# Patient Record
Sex: Male | Born: 1960 | Race: White | Hispanic: No | Marital: Married | Smoking: Current every day smoker
Health system: Southern US, Community
[De-identification: ages and names within clinical notes are randomized; demographics above are authoritative.]

## PROBLEM LIST (undated history)

## (undated) DIAGNOSIS — R112 Nausea with vomiting, unspecified: Secondary | ICD-10-CM

## (undated) DIAGNOSIS — R634 Abnormal weight loss: Secondary | ICD-10-CM

## (undated) DIAGNOSIS — R197 Diarrhea, unspecified: Secondary | ICD-10-CM

## (undated) DIAGNOSIS — I10 Essential (primary) hypertension: Secondary | ICD-10-CM

## (undated) DIAGNOSIS — J3489 Other specified disorders of nose and nasal sinuses: Secondary | ICD-10-CM

## (undated) DIAGNOSIS — Z87440 Personal history of urinary (tract) infections: Secondary | ICD-10-CM

## (undated) DIAGNOSIS — E785 Hyperlipidemia, unspecified: Secondary | ICD-10-CM

## (undated) DIAGNOSIS — N189 Chronic kidney disease, unspecified: Secondary | ICD-10-CM

## (undated) DIAGNOSIS — Z973 Presence of spectacles and contact lenses: Secondary | ICD-10-CM

## (undated) HISTORY — DX: Nausea with vomiting, unspecified: R11.2

## (undated) HISTORY — PX: INNER EAR SURGERY: SHX679

## (undated) HISTORY — DX: Chronic kidney disease, unspecified: N18.9

## (undated) HISTORY — DX: Presence of spectacles and contact lenses: Z97.3

## (undated) HISTORY — DX: Other specified disorders of nose and nasal sinuses: J34.89

## (undated) HISTORY — PX: HERNIA REPAIR: SHX51

## (undated) HISTORY — DX: Essential (primary) hypertension: I10

## (undated) HISTORY — PX: URETHRAL DILATION: SUR417

## (undated) HISTORY — PX: OTHER SURGICAL HISTORY: SHX169

## (undated) HISTORY — DX: Abnormal weight loss: R63.4

## (undated) HISTORY — DX: Personal history of urinary (tract) infections: Z87.440

## (undated) HISTORY — DX: Hyperlipidemia, unspecified: E78.5

## (undated) HISTORY — DX: Diarrhea, unspecified: R19.7

---

## 2011-03-08 ENCOUNTER — Inpatient Hospital Stay (INDEPENDENT_AMBULATORY_CARE_PROVIDER_SITE_OTHER)
Admission: RE | Admit: 2011-03-08 | Discharge: 2011-03-08 | Disposition: A | Discharge status: Home / Self Care | Payer: 59 | Source: Ambulatory Visit | Attending: Family Medicine | Admitting: Family Medicine

## 2011-03-08 ENCOUNTER — Other Ambulatory Visit: Payer: Self-pay | Admitting: Family Medicine

## 2011-03-08 ENCOUNTER — Ambulatory Visit
Admission: RE | Admit: 2011-03-08 | Discharge: 2011-03-08 | Disposition: A | Discharge status: Home / Self Care | Payer: 59 | Source: Ambulatory Visit | Attending: Family Medicine | Admitting: Family Medicine

## 2011-03-08 ENCOUNTER — Encounter: Payer: Self-pay | Admitting: Family Medicine

## 2011-03-08 DIAGNOSIS — J449 Chronic obstructive pulmonary disease, unspecified: Secondary | ICD-10-CM | POA: Insufficient documentation

## 2011-03-08 DIAGNOSIS — E785 Hyperlipidemia, unspecified: Secondary | ICD-10-CM | POA: Insufficient documentation

## 2011-03-08 DIAGNOSIS — J4489 Other specified chronic obstructive pulmonary disease: Secondary | ICD-10-CM | POA: Insufficient documentation

## 2011-03-08 DIAGNOSIS — I10 Essential (primary) hypertension: Secondary | ICD-10-CM | POA: Insufficient documentation

## 2011-03-08 DIAGNOSIS — M109 Gout, unspecified: Secondary | ICD-10-CM | POA: Insufficient documentation

## 2011-03-08 DIAGNOSIS — R079 Chest pain, unspecified: Secondary | ICD-10-CM

## 2011-03-08 DIAGNOSIS — R1011 Right upper quadrant pain: Secondary | ICD-10-CM

## 2011-03-08 DIAGNOSIS — E119 Type 2 diabetes mellitus without complications: Secondary | ICD-10-CM | POA: Insufficient documentation

## 2011-03-08 LAB — CONVERTED CEMR LAB
Bilirubin Urine: NEGATIVE
Protein, U semiquant: NEGATIVE
Urobilinogen, UA: 0.2
WBC Urine, dipstick: NEGATIVE

## 2011-03-09 ENCOUNTER — Encounter: Payer: Self-pay | Admitting: Family Medicine

## 2011-03-12 ENCOUNTER — Ambulatory Visit
Admission: RE | Admit: 2011-03-12 | Discharge: 2011-03-12 | Disposition: A | Discharge status: Home / Self Care | Payer: 59 | Source: Ambulatory Visit | Attending: Family Medicine | Admitting: Family Medicine

## 2011-03-12 ENCOUNTER — Encounter (INDEPENDENT_AMBULATORY_CARE_PROVIDER_SITE_OTHER): Payer: Self-pay | Admitting: General Surgery

## 2011-03-12 DIAGNOSIS — R1011 Right upper quadrant pain: Secondary | ICD-10-CM

## 2011-03-13 ENCOUNTER — Ambulatory Visit (INDEPENDENT_AMBULATORY_CARE_PROVIDER_SITE_OTHER): Payer: 59 | Admitting: General Surgery

## 2011-03-13 ENCOUNTER — Encounter (INDEPENDENT_AMBULATORY_CARE_PROVIDER_SITE_OTHER): Payer: Self-pay | Admitting: General Surgery

## 2011-03-13 VITALS — BP 134/74 | HR 93 | Ht 70.5 in | Wt 245.2 lb

## 2011-03-13 DIAGNOSIS — K811 Chronic cholecystitis: Secondary | ICD-10-CM

## 2011-03-13 MED ORDER — HYDROCODONE-ACETAMINOPHEN 5-500 MG PO TABS: 2.0000 | ORAL_TABLET | Freq: Four times a day (QID) | ORAL | Status: AC | PRN

## 2011-03-13 MED ORDER — CIPROFLOXACIN HCL 500 MG PO TABS: 500.0000 mg | ORAL_TABLET | Freq: Two times a day (BID) | ORAL | Status: AC

## 2011-03-13 NOTE — Progress Notes (Signed)
Subjective:     Patient ID: Jason Booker, male   DOB: 04-May-1961, 50 y.o.   MRN: [id number]  HPI This is a 50 year old Caucasian male with non-insulin-dependent diabetes, hyperlipidemia, and hypertension and obesity. He is referred to me by Dr. Elsie Saas at the [facility] med center urgent care [facility] for evaluation of gallbladder disease  The patient gives a three-week history of intermittent episodes of right upper quadrant pain right flank pain and back pain. This has been intermittent but has been getting worse. He's had some nausea but no vomiting or fever. He had a couple episodes of diarrhea which is now resolved. He denies any prior similar problems. The pain is worse after meals and he is restricting his diet for the past few days and he feels much better today.  He was evaluated at med center urgent care [facility] on March 08, 2011. Gallbladder ultrasound shows a distended gallbladder with gallbladder polyps, minimal pericholecystic fluid, borderline dilated common bile duct and equivocal Murphy sign. Dr. Park Liter felt that this was consistent with acalculous cholecystitis. Diffuse fatty infiltration of the liver was also noted.  Liver function tests and renal function were normal. Amylase and lipase were normal. Urinalysis was clear.  He is feeling better today.  Past Medical History  Diagnosis Date  . Diabetes mellitus   . Gout   . Hyperlipidemia   . Hypertension   . Weight loss   . Nausea vomiting and diarrhea   . Hearing loss   . Sinus pain   . Wears glasses   . Chronic kidney disease   . History of recurrent UTIs     has a current medication list which includes the following prescription(s): aspirin, bupropion, cinnamon, fenofibrate, lisinopril, metformin, promethazine, simvastatin, vitamin b-12, ciprofloxacin, and hydrocodone-acetaminophen.  Allergies  Allergen Reactions  . Amoxicillin   . Codeine Hives  . Demerol     Family History  Problem Relation  Age of Onset  . Cancer Mother     lung   . Heart disease Father   . Diabetes Father   . Dementia Father     History  Substance Use Topics  . Smoking status: Current Everyday Smoker -- 1.0 packs/day  . Smokeless tobacco: Not on file  . Alcohol Use: No   Review of Systems  Constitutional: Positive for appetite change. Negative for fever, chills, diaphoresis, fatigue and unexpected weight change.  HENT: Negative.   Eyes: Negative.   Respiratory: Negative.   Cardiovascular: Negative.   Gastrointestinal: Positive for nausea, abdominal pain and diarrhea. Negative for vomiting, constipation, blood in stool, abdominal distention, anal bleeding and rectal pain.  Genitourinary: Negative.   Musculoskeletal: Negative.   Neurological: Negative.   Hematological: Negative.   Psychiatric/Behavioral: Negative.        Objective:   Physical Exam  Constitutional: He is oriented to person, place, and time. He appears well-developed and well-nourished. No distress.  HENT:  Head: Normocephalic and atraumatic.  Mouth/Throat: No oropharyngeal exudate.  Eyes: Conjunctivae and EOM are normal. Pupils are equal, round, and reactive to light. Left eye exhibits discharge. No scleral icterus.  Neck: Normal range of motion. Neck supple. No tracheal deviation present. No thyromegaly present.  Cardiovascular: Normal rate, regular rhythm and normal heart sounds.   No murmur heard. Pulmonary/Chest: Effort normal and breath sounds normal. No respiratory distress. He has no wheezes. He has no rales. He exhibits no tenderness.  Abdominal: Soft. Bowel sounds are normal. He exhibits no distension and no mass. There is tenderness.  There is no rebound and no guarding.    Genitourinary: No penile tenderness.  Musculoskeletal: Normal range of motion. He exhibits no edema and no tenderness.  Lymphadenopathy:    He has no cervical adenopathy.  Neurological: He is alert and oriented to person, place, and time. No  cranial nerve deficit. He exhibits normal muscle tone.  Skin: Skin is warm and dry. No rash noted. He is not diaphoretic. No erythema. No pallor.  Psychiatric: He has a normal mood and affect. His behavior is normal. Judgment and thought content normal.       Assessment:     Chronic cholecystitis, this may be acalculus or calculus.  Obesity.  Non-insulin-dependent diabetes mellitus.  Hyperlipidemia.  Hypertension.  Status post bilateral inguinal hernia repair.  Status post diagnostic laparoscopy and urethral dilatation for urethral stricture. Details unknown.    Plan:       Considering his diabetes, history, physical, and ultrasound findings, I think that we should proceed with cholecystectomy in the near future. He is at risk for complications from his biliary tract disease.  We will schedule for laparoscopic cholecystectomy with cholangiogram.  I discussed the indications and details of the surgery with him. Risks and complications have been outlined, including but limited to bleeding, infection, conversion to open laparotomy, injury to adjacent organs such as the intestine or major bile duct with major reconstructive surgery, bile leak, wound healing problems, cardiac pulmonary and thromboembolic problems. He seems to understand these issues well. At this time all of his questions are answered. He is in full agreement as planned. We'll schedule in the very near future.

## 2011-03-13 NOTE — Patient Instructions (Signed)
We have called in a prescription for Cipro to your pharmacy in [place]. We have written a prescription for Vicodin tablets for your pain. You are to adhere to a very low fat diet and to stay hydrated. Keep a close monitoring on your diabetes and blood sugars. We will schedule you for a laparoscopic cholecystectomy with cholangiogram as soon as possible in the future.

## 2011-03-21 ENCOUNTER — Other Ambulatory Visit (INDEPENDENT_AMBULATORY_CARE_PROVIDER_SITE_OTHER): Payer: Self-pay | Admitting: General Surgery

## 2011-03-21 ENCOUNTER — Encounter (HOSPITAL_COMMUNITY): Payer: 59

## 2011-03-21 LAB — COMPREHENSIVE METABOLIC PANEL
CO2: 25 mEq/L (ref 19–32)
Chloride: 99 mEq/L (ref 96–112)
GFR calc Af Amer: >60 mL/min (ref >60)
Potassium: 3.5 mEq/L (ref 3.5–5.1)
Sodium: 136 mEq/L (ref 135–145)

## 2011-03-21 LAB — URINALYSIS, ROUTINE W REFLEX MICROSCOPIC
Glucose, UA: NEGATIVE mg/dL
Ketones, ur: NEGATIVE mg/dL
Leukocytes, UA: NEGATIVE
Specific Gravity, Urine: 1.017 (ref 1.005–1.030)
pH: 6.5 (ref 5.0–8.0)

## 2011-03-21 LAB — DIFFERENTIAL
Basophils Relative: 1 % (ref 0–1)
Lymphs Abs: 3.3 10*3/uL (ref 0.7–4.0)
Monocytes Relative: 8 % (ref 3–12)
Neutro Abs: 4.2 10*3/uL (ref 1.7–7.7)
Neutrophils Relative %: 50 % (ref 43–77)

## 2011-03-21 LAB — CBC
Hemoglobin: 15.5 g/dL (ref 13.0–17.0)
MCH: 30 pg (ref 26.0–34.0)
RBC: 5.17 MIL/uL (ref 4.22–5.81)

## 2011-03-21 LAB — SURGICAL PCR SCREEN: MRSA, PCR: NEGATIVE

## 2011-03-27 ENCOUNTER — Ambulatory Visit (HOSPITAL_COMMUNITY)
Admission: RE | Admit: 2011-03-27 | Discharge: 2011-03-28 | Disposition: A | Discharge status: Home / Self Care | Payer: 59 | Source: Ambulatory Visit | Attending: General Surgery | Admitting: General Surgery

## 2011-03-27 ENCOUNTER — Other Ambulatory Visit (INDEPENDENT_AMBULATORY_CARE_PROVIDER_SITE_OTHER): Payer: Self-pay | Admitting: General Surgery

## 2011-03-27 ENCOUNTER — Ambulatory Visit (HOSPITAL_COMMUNITY): Payer: 59

## 2011-03-27 DIAGNOSIS — Z01810 Encounter for preprocedural cardiovascular examination: Secondary | ICD-10-CM | POA: Insufficient documentation

## 2011-03-27 DIAGNOSIS — E119 Type 2 diabetes mellitus without complications: Secondary | ICD-10-CM | POA: Insufficient documentation

## 2011-03-27 DIAGNOSIS — K8010 Calculus of gallbladder with chronic cholecystitis without obstruction: Secondary | ICD-10-CM

## 2011-03-27 DIAGNOSIS — E785 Hyperlipidemia, unspecified: Secondary | ICD-10-CM | POA: Insufficient documentation

## 2011-03-27 DIAGNOSIS — K819 Cholecystitis, unspecified: Secondary | ICD-10-CM

## 2011-03-27 DIAGNOSIS — Z01812 Encounter for preprocedural laboratory examination: Secondary | ICD-10-CM | POA: Insufficient documentation

## 2011-03-27 DIAGNOSIS — F17200 Nicotine dependence, unspecified, uncomplicated: Secondary | ICD-10-CM | POA: Insufficient documentation

## 2011-03-27 DIAGNOSIS — I10 Essential (primary) hypertension: Secondary | ICD-10-CM | POA: Insufficient documentation

## 2011-03-27 DIAGNOSIS — R1011 Right upper quadrant pain: Secondary | ICD-10-CM | POA: Insufficient documentation

## 2011-03-27 DIAGNOSIS — G4733 Obstructive sleep apnea (adult) (pediatric): Secondary | ICD-10-CM | POA: Insufficient documentation

## 2011-03-27 LAB — GLUCOSE, CAPILLARY
Glucose-Capillary: 120 mg/dL — ABNORMAL HIGH (ref 70–99)
Glucose-Capillary: 136 mg/dL — ABNORMAL HIGH (ref 70–99)

## 2011-03-28 ENCOUNTER — Telehealth (INDEPENDENT_AMBULATORY_CARE_PROVIDER_SITE_OTHER): Payer: Self-pay

## 2011-03-28 LAB — GLUCOSE, CAPILLARY

## 2011-03-28 NOTE — Telephone Encounter (Signed)
NEED POST OP APPOINTMENT WITH DR. Zachery Dakins

## 2011-03-28 NOTE — Op Note (Signed)
Jason Booker, Jason Booker           ACCOUNT NO.:  [id number]  MEDICAL RECORD NO.:  [id number]  LOCATION:  1535                         FACILITY:  [facility]  PHYSICIAN:  Angelia Mould. Derrell Lolling, M.D.DATE OF BIRTH:  [birth year]  DATE OF PROCEDURE:  03/27/2011 DATE OF DISCHARGE:                              OPERATIVE REPORT   PREOPERATIVE DIAGNOSIS:  Chronic cholecystitis.  POSTOPERATIVE DIAGNOSIS:  Chronic cholecystitis.  OPERATION PERFORMED:  Laparoscopic cholecystectomy with intraoperative cholangiogram.  SURGEON:  Angelia Mould. Derrell Lolling, MD  FIRST ASSISTANT:  Anselm Pancoast. Weatherly, MD  OPERATIVE INDICATIONS:  This is a 50 year old Caucasian male with non- insulin dependent diabetes, hyperlipidemia, hypertension, and morbid obesity.  He presented recently with several-week history of intermittent episodes of right upper quadrant pain, right flank pain, and back pain with some nausea.  Lab work is unremarkable.  Ultrasound showed a distended gallbladder with gallbladder polyps, minimal pericholecystic fluid, borderline dilated common duct.  He was evaluated by me as an outpatient.  I felt that he was having gallbladder attacks. I told him that I was not sure whether he had gallstones or not, but he was offered cholecystectomy.  He is scheduled for that surgery electively.  OPERATIVE FINDINGS:  The gallbladder was clearly and chronically inflamed.  There were extensive omental adhesions completely covering the gallbladder which took about 15 minutes to take down.  The gallbladder itself was not acutely inflamed, somewhat discolored, but thin walled.  The bile in the gallbladder was clear and green in color. The intraoperative cholangiogram was normal showing normal intrahepatic and extrahepatic bile ducts, no filling defect, and no obstruction with good flow of contrast into the duodenum.  The bile ducst did not appear dilated.  The liver appeared normal in color and texture, but was  firm and heavy.  The small bowel and large bowel were grossly normal to inspection, however, the omentum was quite large and obscured most of the visualization.  Peritoneal surface and undersurface of the diaphragms looked normal.  OPERATIVE TECHNIQUE:  Following the induction of general endotracheal anesthesia, the patient's abdomen was prepped and draped in a sterile fashion.  Intravenous antibiotics were given preop.  A surgical time-out was held identifying correct patient and correct procedure.  A 0.5% Marcaine with epinephrine was used as local infiltration anesthetic.  A transverse incision was made at the superior rim of the umbilicus.  The fascia was incised in the midline.  An 11 mm Hasson trocar was inserted and secured with purse-string suture of 0 Vicryl.  Pneumoperitoneum was created.  Two 5 mm trocars were placed in the right upper quadrant and an 11 mm trocar placed in the subxiphoid area.  The patient was positioned.  We lifted the liver up.  We could see the area of the gallbladder, but mostly this was obscured by omental adhesions.  Using sharp dissection and electrocautery, we slowly peeled this away from the upper fundus and then we could grab the upper end of the fundus.  We lifted this up and then we slowly divided and stripped the omentum off the gallbladder.  This took several steps and about 15 minutes of dissection.  At the end of this, we were able to identify the fundus,  body, and infundibulum of the gallbladder.  We then dissected out the critical structures.  We isolated the cystic artery as it went onto the wall of the gallbladder, secured it with metal clips, and divided.  We slowly created a large window behind the cystic duct.  Once we had a good critical view, we inserted a cholangiogram catheter into the cystic duct.  A cholangiogram was obtained using the C-arm.  The cholangiogram was normal as described above.  The cholangiogram catheter was  removed, the cystic duct secured with multiple metal clips, and divided.  The gallbladder was dissected from its bed with electrocautery, placed in a specimen bag, and removed.  The operative field was copiously irrigated. Hemostasis was excellent and achieved with electrocautery.  At the end of the case, the irrigation fluid was completely clear and there was no evidence of bleeding or bile leak or whatsoever.  The trocars were removed under direct vision.  There was no bleeding from trocar sites. The  pneumoperitoneum was released.  The fascia at the umbilicus was closed with 0 Vicryl sutures and the skin incision was closed with subcuticular sutures of 4-0 Monocryl and Dermabond.  The patient was transported then to the recovery room in stable condition.  Estimated blood loss was about 15-20 cc.  Complications none.  Sponge, needle, and instrument counts were correct.     Angelia Mould. Derrell Lolling, M.D.     HMI/MEDQ  D:  03/27/2011  T:  03/28/2011  Job:  [id number]  cc:   Dr. Donna Christen [facility], [facility]  Electronically Signed by Claud Kelp M.D. on 03/28/2011 07:27:03 AM

## 2011-03-28 NOTE — Telephone Encounter (Signed)
Po call made to pt. Pt home doing well. Pt req to call back to make po appt when he has his schedule. Pt to call with any concerns.

## 2011-04-02 ENCOUNTER — Encounter (INDEPENDENT_AMBULATORY_CARE_PROVIDER_SITE_OTHER): Payer: Self-pay

## 2011-04-11 ENCOUNTER — Other Ambulatory Visit: Payer: Self-pay | Admitting: Emergency Medicine

## 2011-04-11 ENCOUNTER — Emergency Department (HOSPITAL_COMMUNITY): Payer: 59

## 2011-04-11 ENCOUNTER — Telehealth (INDEPENDENT_AMBULATORY_CARE_PROVIDER_SITE_OTHER): Payer: Self-pay | Admitting: General Surgery

## 2011-04-11 ENCOUNTER — Emergency Department (HOSPITAL_COMMUNITY)
Admission: EM | Admit: 2011-04-11 | Discharge: 2011-04-12 | Disposition: A | Discharge status: Home / Self Care | Payer: 59 | Attending: Emergency Medicine | Admitting: Emergency Medicine

## 2011-04-11 DIAGNOSIS — K644 Residual hemorrhoidal skin tags: Secondary | ICD-10-CM | POA: Insufficient documentation

## 2011-04-11 DIAGNOSIS — K625 Hemorrhage of anus and rectum: Secondary | ICD-10-CM | POA: Insufficient documentation

## 2011-04-11 DIAGNOSIS — R109 Unspecified abdominal pain: Secondary | ICD-10-CM | POA: Insufficient documentation

## 2011-04-11 DIAGNOSIS — E119 Type 2 diabetes mellitus without complications: Secondary | ICD-10-CM | POA: Insufficient documentation

## 2011-04-11 DIAGNOSIS — R52 Pain, unspecified: Secondary | ICD-10-CM

## 2011-04-11 DIAGNOSIS — Z9089 Acquired absence of other organs: Secondary | ICD-10-CM | POA: Insufficient documentation

## 2011-04-11 LAB — OCCULT BLOOD, POC DEVICE: Fecal Occult Bld: POSITIVE

## 2011-04-12 ENCOUNTER — Encounter (HOSPITAL_COMMUNITY): Payer: Self-pay

## 2011-04-16 ENCOUNTER — Telehealth (INDEPENDENT_AMBULATORY_CARE_PROVIDER_SITE_OTHER): Payer: Self-pay

## 2011-04-16 LAB — COMPREHENSIVE METABOLIC PANEL
ALT: 30 U/L (ref 0–53)
AST: 22 U/L (ref 0–37)
Albumin: 4.6 g/dL (ref 3.5–5.2)
Alkaline Phosphatase: 62 U/L (ref 39–117)
Chloride: 101 mEq/L (ref 96–112)
Potassium: 3.8 mEq/L (ref 3.5–5.1)
Total Bilirubin: 0.5 mg/dL (ref 0.3–1.2)

## 2011-04-16 LAB — URINALYSIS, ROUTINE W REFLEX MICROSCOPIC
Bilirubin Urine: NEGATIVE
Hgb urine dipstick: NEGATIVE
Ketones, ur: NEGATIVE mg/dL
Nitrite: NEGATIVE
pH: 5.5 (ref 5.0–8.0)

## 2011-04-16 LAB — CBC
HCT: 41.2 % (ref 39.0–52.0)
Platelets: 208 10*3/uL (ref 150–400)
RDW: 12.7 % (ref 11.5–15.5)

## 2011-04-16 LAB — DIFFERENTIAL
Basophils Relative: 0 % (ref 0–1)
Eosinophils Absolute: 0.2 10*3/uL (ref 0.0–0.7)
Eosinophils Relative: 2 % (ref 0–5)
Monocytes Absolute: 0.6 10*3/uL (ref 0.1–1.0)
Monocytes Relative: 7 % (ref 3–12)

## 2011-04-16 NOTE — Telephone Encounter (Signed)
Pt called requesting a rtw note to be faxed to his work at [phone]. Pt is 3 wk po.  Pt questioned any restrictions when rtw. I reviewed this with Dr Derrell Lolling. Rip Harbour to rtw full duty no restrictions. I called pt advised him ok to rtw and have faxed rtw note to # pt gave me.

## 2011-04-18 ENCOUNTER — Ambulatory Visit (INDEPENDENT_AMBULATORY_CARE_PROVIDER_SITE_OTHER): Payer: 59 | Admitting: General Surgery

## 2011-04-18 ENCOUNTER — Encounter (INDEPENDENT_AMBULATORY_CARE_PROVIDER_SITE_OTHER): Payer: Self-pay | Admitting: General Surgery

## 2011-04-18 VITALS — BP 130/76 | HR 66 | Temp 97.1°F

## 2011-04-18 DIAGNOSIS — Z9889 Other specified postprocedural states: Secondary | ICD-10-CM

## 2011-04-18 NOTE — Patient Instructions (Signed)
You have recovered uneventfully from your gallbladder surgery. You have a copy of your pathology report which shows chronically inflamed gallbladder and gallstones. There was no evidence of cancer. You may return to work and all normal  physical activities without restriction. Do not smoke. Continue to try to lose weight. Keep your appointment with digestive health services for a colonoscopy and evaluation of your liver. Return to see me if any further problems arise.

## 2011-04-18 NOTE — Progress Notes (Signed)
Subjective:     Patient ID: Jason Booker, male   DOB: 08/24/1961, 50 y.o.   MRN: [id number]  HPI This patient underwent laparoscopic cholecystectomy with cholangiogram on March 28, 2011. He had extensive adhesions to the gallbladder. The cholangiogram was normal. Pathology report showed chronic cholecystitis with cholelithiasis.  The patient presented to the emergency room a few days later with low-volume bright red rectal bleeding and right flank pain. He says but the rectal bleeding and flank pain had resolved. He's had no fever or chills. He denies nausea or vomiting. He had a CT scan which showed no significant intra-abdominal complications from his surgery. Portosystemic shunting along the anterior root of the mesentery was suspected. The cause of right flank pain was not identified.  The patient states that he remains asymptomatic but was evaluated by his primary care physician Dr. Gwenith Spitz. He states that he has been referred to digestive health services in [place] for colonoscopy and further GI evaluation.  The patient is asymptomatic today.   Review of Systems     Objective:   Physical Exam The patient looks well. He is in no distress.  Obese soft nontender. All trocar sites are well healed. He is not distended.    Assessment:     Chronic cholecystitis with cholelithiasis, recovering without any apparent intra-abdominal complication of the surgery.  Recent episode of low volume rectal bleeding and right flank pain, resolved, etiology unclear.    Plan:     The patient will be evaluated by digestive health services at [facility]. Colonoscopy will probably be performed.  Return to see me p.r.n.

## 2011-07-29 NOTE — Progress Notes (Signed)
Summary: SIDE/RIB PAIN rm 4   Vital Signs:  Patient Profile:   50 Years Old Male CC:      pain under RT rib cage area x 1 1/2 wk Height:     71 inches Weight:      248 pounds O2 Sat:      96 % O2 treatment:    Room Air Temp:     98.0 degrees F oral Pulse rate:   84 / minute Resp:     20 per minute BP sitting:   101 / 67  (left arm) Cuff size:   large  Pt. in pain?   yes    Location:   RUQ abd pain    Intensity:   6    Type:       dull  Vitals Entered By: Clemens Catholic LPN (March 08, 2011 11:04 AM)                   Updated Prior Medication List: METFORMIN HCL 500 MG TABS (METFORMIN HCL)  WELLBUTRIN 100 MG TABS (BUPROPION HCL)  BP med- unsure of name cholestrol med- unsure of name (statin) ? tricor- unsure of name  Current Allergies: ! DEMEROL ! AMOXICILLINHistory of Present Illness Chief Complaint: pain under RT rib cage area x 1 1/2 wk History of Present Illness:  Subjective:  Patient complains of onset of mild intermittent right lower backache about 3 weeks ago.  The pain gradually began migrating to his right lower chest and upper abdomen, and now occurs primarily after eating.  It has gradually become worse, and lasting longer, after eating.  He often has nausea but no vomiting.  The pain is dull and sometimes affects his sleep.  Over the past several days he has had loose stools, now resolving.  No fevers, chills, and sweats.  No urinary symptoms.  No rash.  No respiratory symptoms but he notes that pain in right abdomen slightly worse with deep inspiration No chest pain or cough.  He smokes:  1 pack per day Past history of exploratory lap. Family history of CHF and DM father, and mother has junst been diagnosed with lung cancer.                                                                                                                                    REVIEW OF SYSTEMS Constitutional Symptoms       Complains of fatigue.     Denies fever, chills, night  sweats, weight loss, and weight gain.  Eyes       Denies change in vision, eye pain, eye discharge, glasses, contact lenses, and eye surgery. Ear/Nose/Throat/Mouth       Denies hearing loss/aids, change in hearing, ear pain, ear discharge, dizziness, frequent runny nose, frequent nose bleeds, sinus problems, sore throat, hoarseness, and tooth pain or bleeding.  Respiratory       Denies dry cough, productive  cough, wheezing, shortness of breath, asthma, bronchitis, and emphysema/COPD.  Cardiovascular       Denies murmurs, chest pain, and tires easily with exhertion.    Gastrointestinal       Complains of diarrhea.      Denies stomach pain, nausea/vomiting, constipation, blood in bowel movements, and indigestion. Genitourniary       Denies painful urination, kidney stones, and loss of urinary control. Neurological       Denies paralysis, seizures, and fainting/blackouts. Musculoskeletal       Complains of decreased range of motion.      Denies muscle pain, joint pain, joint stiffness, redness, swelling, muscle weakness, and gout.  Skin       Denies bruising, unusual mles/lumps or sores, and hair/skin or nail changes.  Psych       Denies mood changes, temper/anger issues, anxiety/stress, speech problems, depression, and sleep problems. Other Comments: pt c/o of RUQ abd pain x 1 1/2 wk radiates to low back sometimes. diarrhea x 4 days. he took an old muscle relaxer with some relief. he has also taken tylenol w/ codeine and IBF with some relief.   Past History:  Past Medical History: Diabetes mellitus, type II Gout Hyperlipidemia Hypertension  Past Surgical History: LT ear surgery double hernia repair  Family History: father Family History Diabetes 1st degree relative CHF  Althiemzers  Social History: Current Smoker 1PPD Alcohol use-no Drug use-no Smoking Status:  current Drug Use:  no   Objective:  Appearance:  Patient is obese but otherwise appears healthy, stated  age, and in no acute distress  Eyes:  Pupils are equal, round, and reactive to light and accomodation.  Extraocular movement is intact.  Conjunctivae are not inflamed.  Mouth/pharynx:  normal Neck:  Supple.  No adenopathy is present.  Lungs:  Clear to auscultation.  Breath sounds are equal.  Heart:  Regular rate and rhythm without murmurs, rubs, or gallops.  Abdomen:  Mild tenderness in the right upper quadrant without masses or hepatosplenomegaly.  Bowel sounds are present.  No CVA or flank tenderness.  Murphy's sign not present. Back:  Nontender. Extremities:  No edema. Skin:  No rash urinalysis (dipstick):  negative CBC:  WBC 8.7 ; LY 39.0, MO 3.1, GR 57.9; Hgb 15.3  Chest X-ray:  IMPRESSION: Probable smoking related chronic lung disease.  No sign of acute chest pathology.  Assessment New Problems: COPD, MILD (ICD-496) RUQ PAIN (ICD-789.01) CHEST PAIN, RIGHT (ICD-786.50) FAMILY HISTORY DIABETES 1ST DEGREE RELATIVE (ICD-V18.0) HYPERTENSION (ICD-401.9) HYPERLIPIDEMIA (ICD-272.4) GOUT (ICD-274.9) DIABETES MELLITUS, TYPE II (ICD-250.00)  SUSPECT BILIARY COLIC  Plan New Medications/Changes: PROMETHAZINE HCL 25 MG TABS (PROMETHAZINE HCL) 1 by mouth HS  as needed nausea  #10 x 1, 03/08/2011, Donna Christen MD LORTAB 7.5 7.5-500 MG TABS (HYDROCODONE-ACETAMINOPHEN) 1 by mouth HS as needed pain  #10 (ten) x 0, 03/08/2011, Donna Christen MD  New Orders: T-Chest x-ray, 2 views [71020] Urinalysis [CPT-81003] CBC w/Diff [40981-19147] T-CMP with estimated GFR [[address]] T-Amylase [82150-23210] T-Lipase [83690-23215] Ultrasound Abdomen [UAS] New Patient Level V [99205] Planning Comments:   CMP, amylase, lipase pending. Schedule GB ultrasound; refer to surgeon if positive. Begin light diet with plenty of fluids.  Avoid fatty foods. Rx for Lortab and phenergan at bedtime. Return for worsening symptoms  Recommend discontinue smoking  Diagnoses and expected course of recovery discussed and will return if not improved as expected or if the condition worsens. Patient and/or caregiver verbalized understanding.  Prescriptions: PROMETHAZINE HCL 25 MG TABS (PROMETHAZINE HCL) 1 by mouth HS  as needed nausea  #10 x 1   Entered and Authorized by:   Donna Christen MD   Signed by:   Donna Christen MD on 03/08/2011   Method used:   Print then Give to Patient   RxID:   [id number] LORTAB 7.5 7.5-500 MG TABS (HYDROCODONE-ACETAMINOPHEN) 1 by mouth HS as needed pain  #10 (ten) x 0   Entered and Authorized by:   Donna Christen MD   Signed by:   Donna Christen MD on 03/08/2011   Method used:   Print then Give to Patient   RxID:   [id number]   Orders Added: 1)  T-Chest x-ray, 2 views [71020] 2)  Urinalysis [CPT-81003] 3)  CBC w/Diff [84696-29528] 4)  T-CMP with estimated GFR [[address]] 5)  T-Amylase [82150-23210] 6)  T-Lipase [83690-23215] 7)  Ultrasound Abdomen [UAS] 8)  New Patient Level V [99205]    Laboratory Results   Urine Tests  Date/Time Received: March 08, 2011 12:08 PM  Date/Time Reported: March 08, 2011 12:08 PM   Routine Urinalysis   Color: yellow Appearance: Clear Glucose: negative   (Normal Range: Negative) Bilirubin: negative   (Normal Range: Negative) Ketone: negative   (Normal Range: Negative) Spec. Gravity: 1.015   (Normal Range: 1.003-1.035) Blood: negative   (Normal Range: Negative) pH: 7.5   (Normal Range: 5.0-8.0) Protein: negative   (Normal Range: Negative) Urobilinogen: 0.2   (Normal Range: 0-1) Nitrite: negative   (Normal Range: Negative) Leukocyte Esterace: negative   (Normal Range:  Negative)

## 2011-07-29 NOTE — Progress Notes (Signed)
Summary: Followup Call  Notified patient of lab results; left message. Donna Christen MD  March 09, 2011 9:32 AM   Discussed results of GB U/S with wife.  Has appt with surgeon. Donna Christen MD  March 14, 2011 2:38 PM

## 2012-10-13 IMAGING — CR DG CHEST 2V
2 series · 2 of 2 positions shown · non-contrast
Comparison: None

CLINICAL DATA: Right-sided chest pain.  Smoking history.

CHEST - 2 VIEW

[view not recorded (1 of 2)]
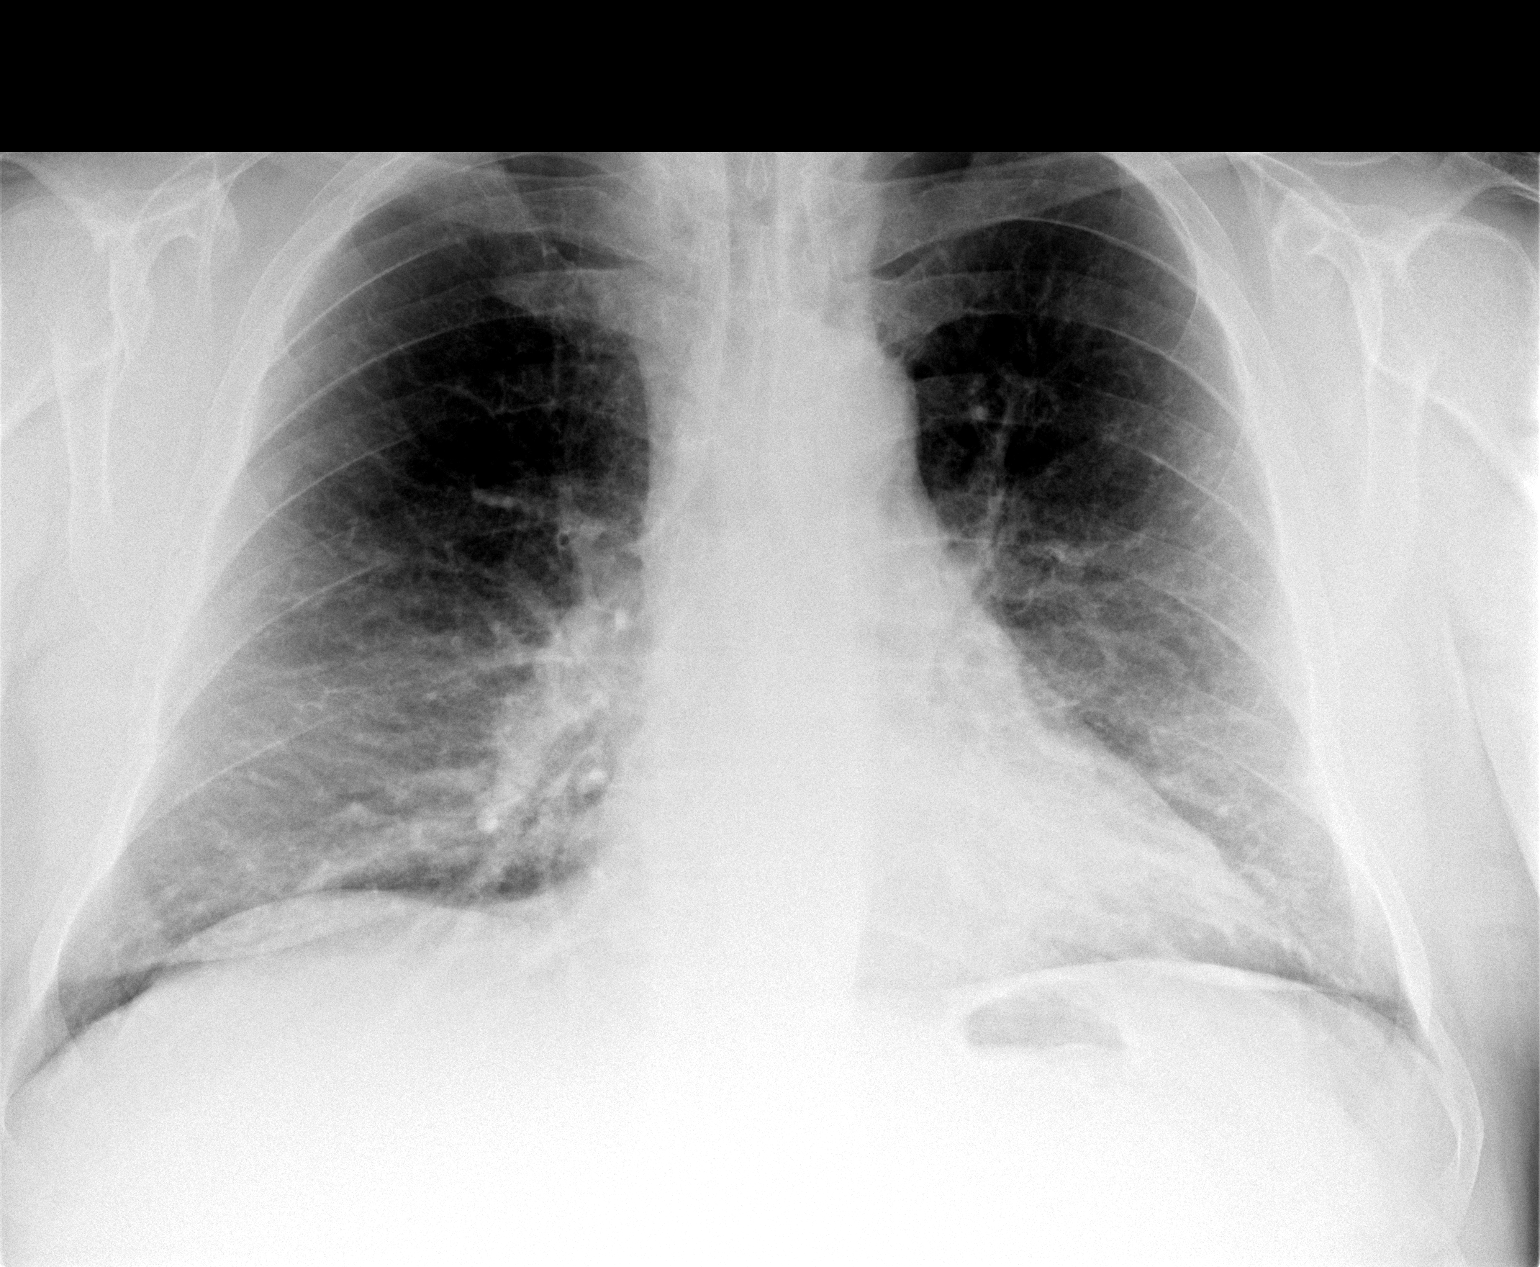

[view not recorded (2 of 2)]
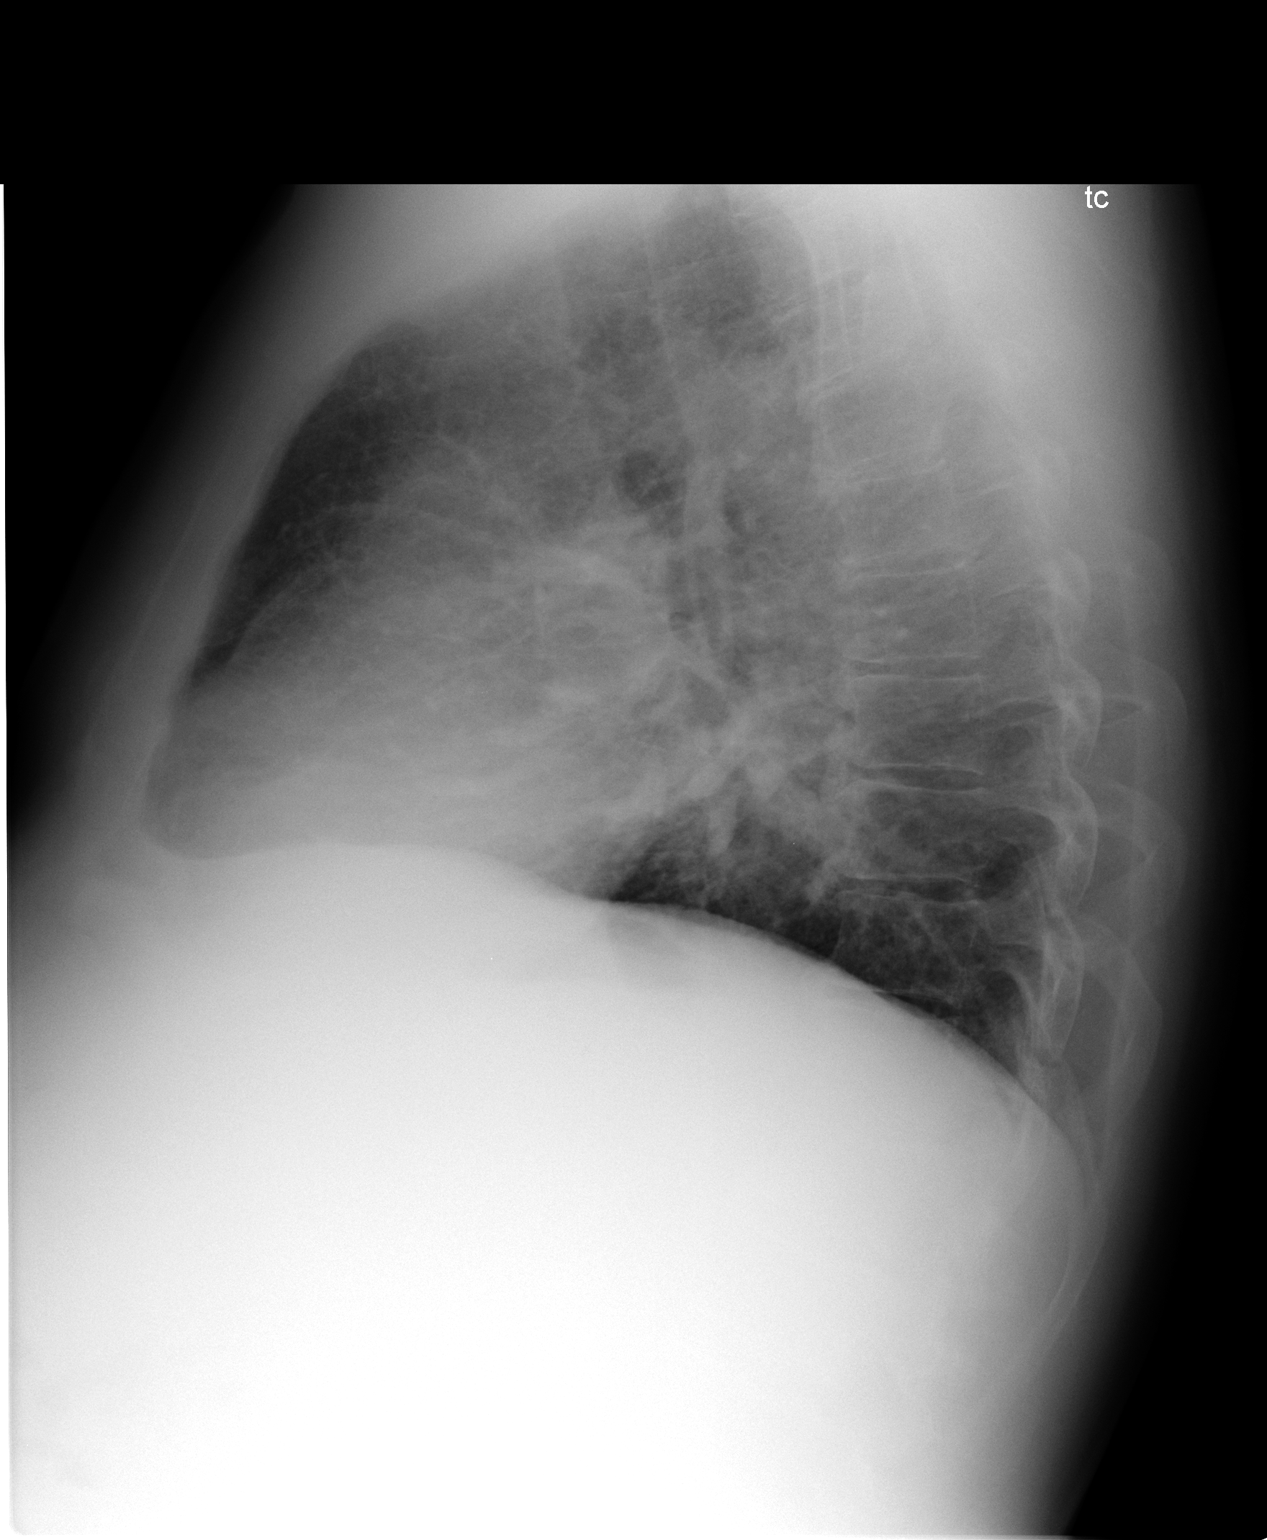

[2 of 2 positions shown; findings below may reference images not displayed]

FINDINGS: Heart size is normal.  Mediastinal shadows are normal.
There is probably smoking related lung disease with some
hyperlucency in the upper lungs and crowding of markings at the
bases suggesting emphysema.  No sign of active infiltrate, mass,
effusion or collapse.  Ordinary degenerative changes effect the
spine.
IMPRESSION: Probable smoking related chronic lung disease.  No sign of acute
chest pathology.

## 2012-10-17 IMAGING — US US ABDOMEN COMPLETE
1 series · 13 of 25 positions shown · non-contrast
Comparison: None

CLINICAL DATA: Right upper quadrant abdominal pain.

COMPLETE ABDOMINAL ULTRASOUND

[Series 1: us abdomen complete · 0.32mm/px · 13 of 87 slices shown]
[im 1/87]
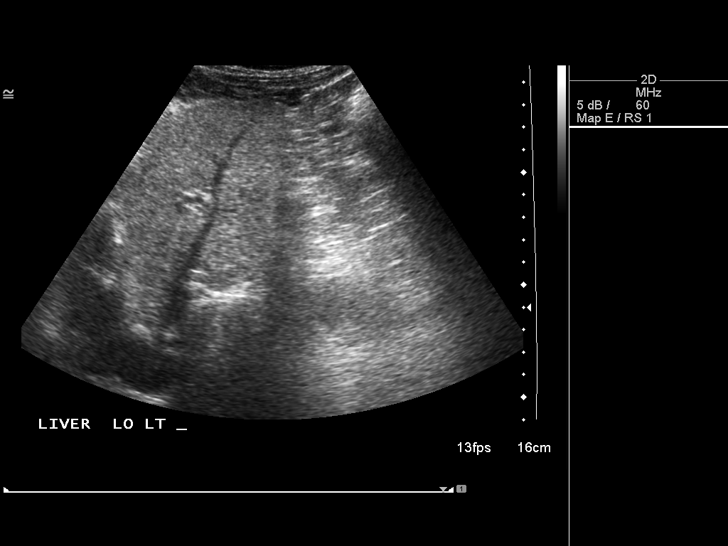
[im 8/87]
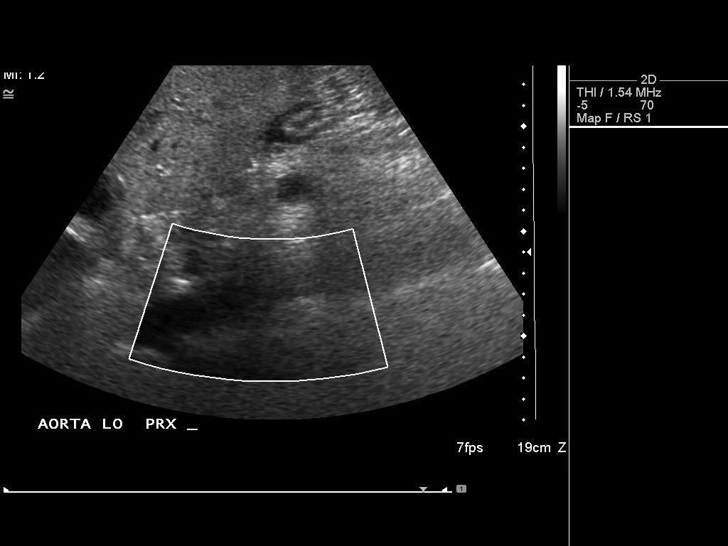
[im 15/87]
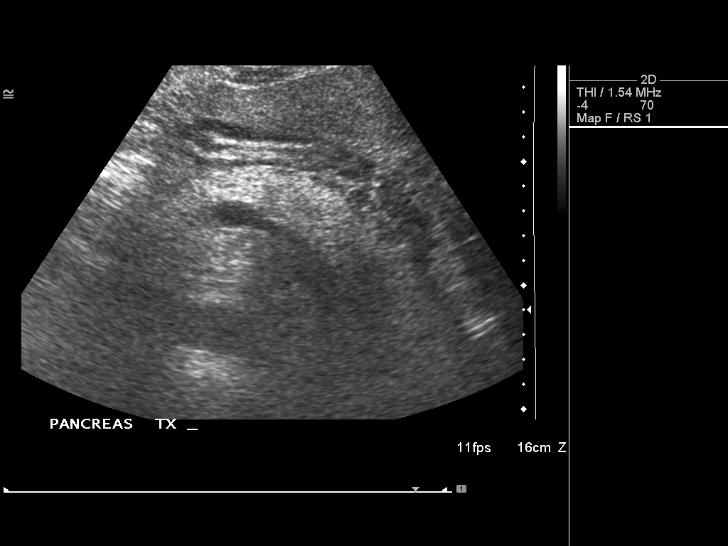
[im 22/87]
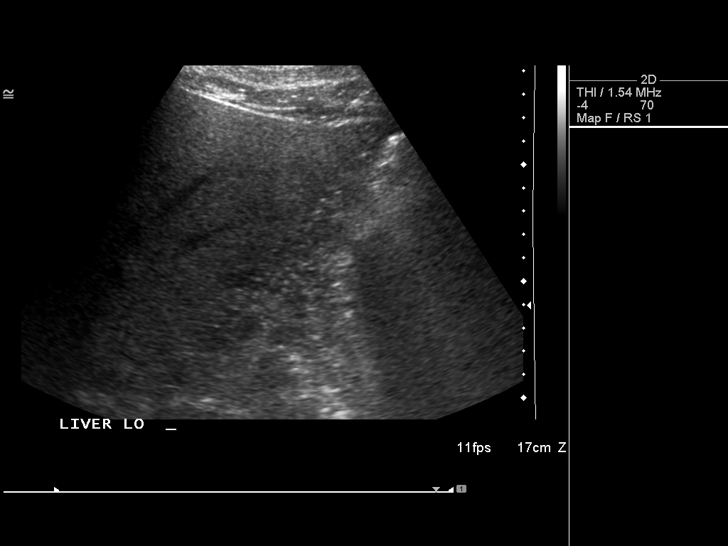
[im 29/87]
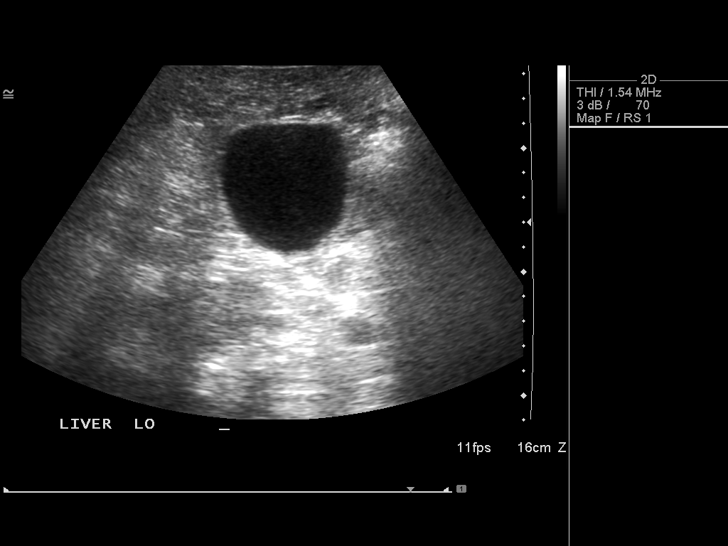
[im 36/87]
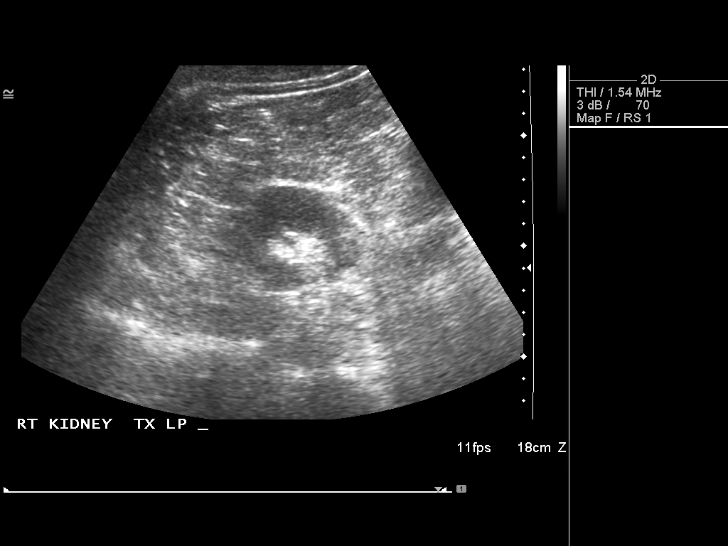
[im 44/87]
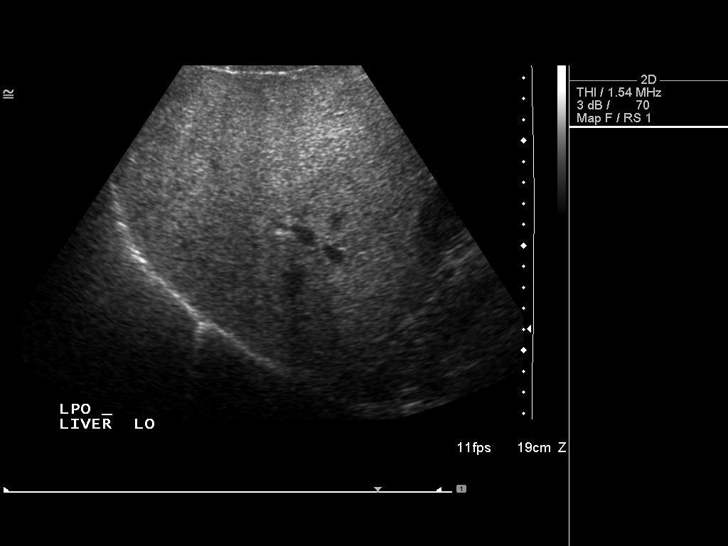
[im 51/87]
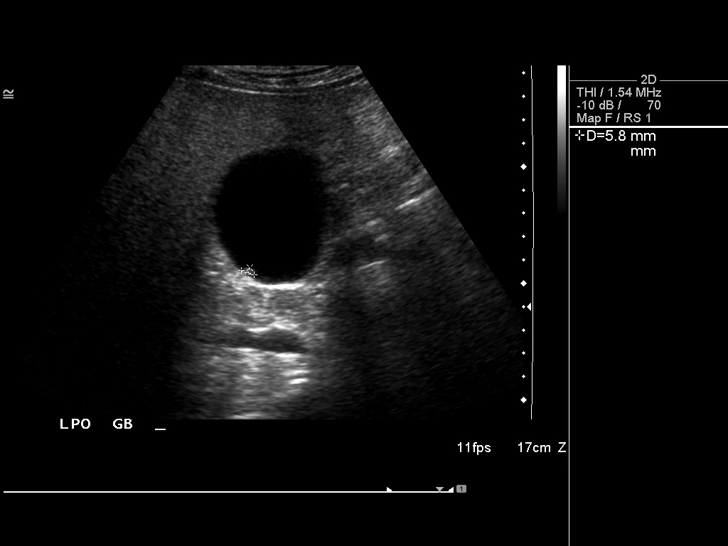
[im 58/87]
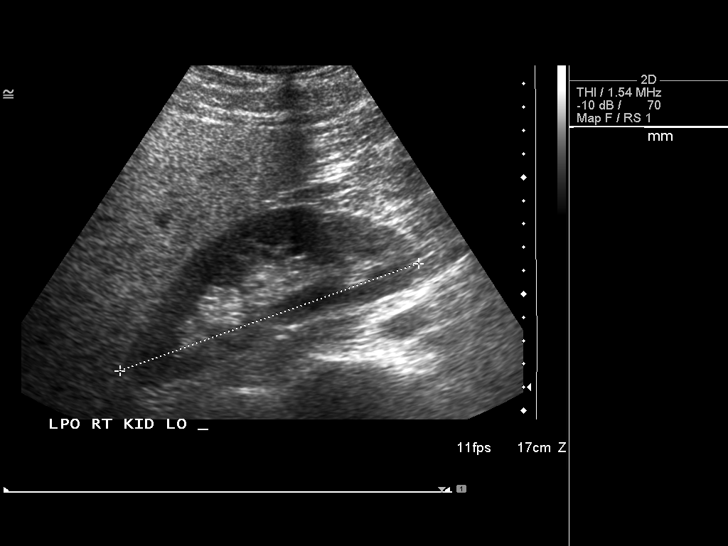
[im 65/87]
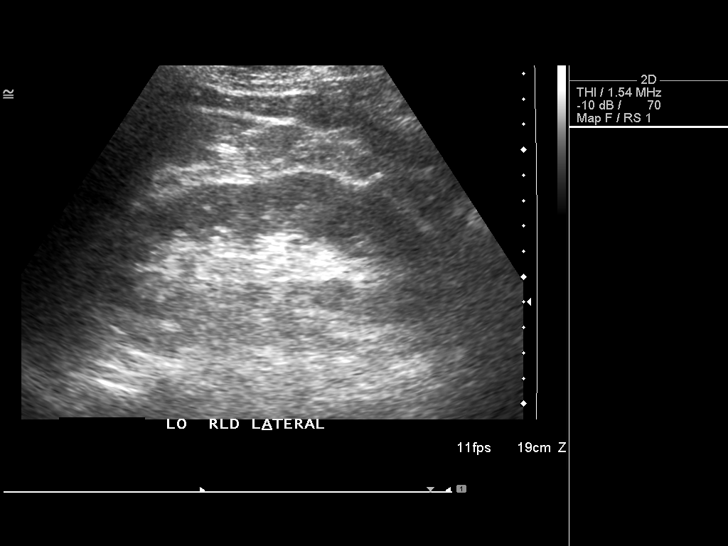
[im 72/87]
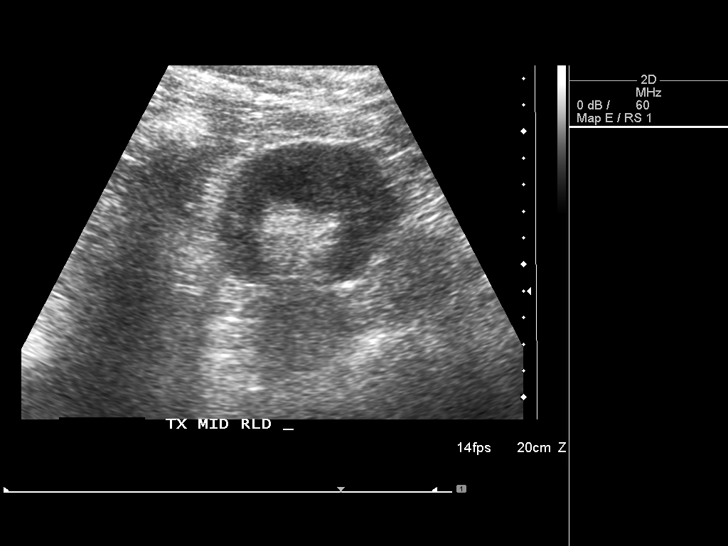
[im 79/87]
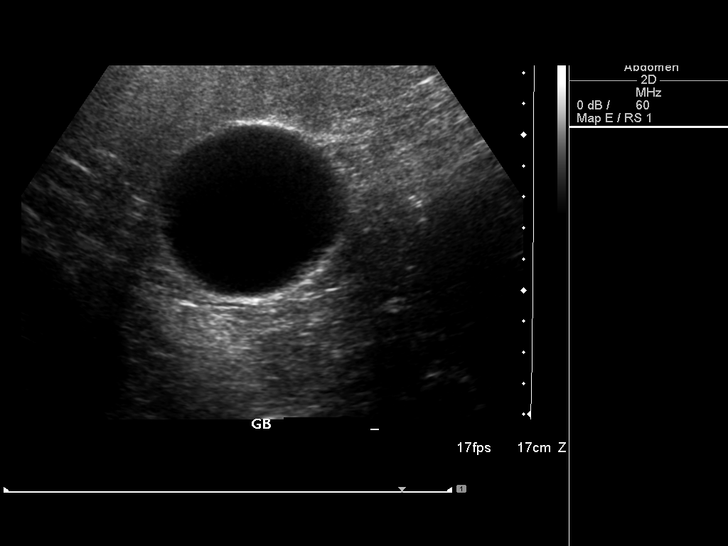
[im 87/87]
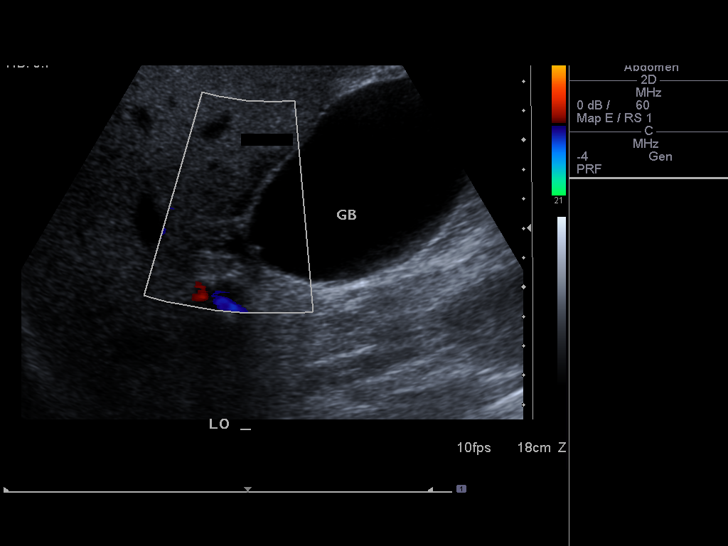

[13 of 25 positions shown; findings below may reference images not displayed]

FINDINGS: Gallbladder:  Mild gallbladder distention.  Small non mobile
echogenic foci are likely the gallbladder polyps.  No definite
gallstones or wall thickening. A very small amount of
pericholecystic fluid is suspected.  Equivocal sonographic Murphy's
sign.

Common bile duct:  Borderline common bile duct dilatation and
mm.

Liver:  Diffuse increased echogenicity suggesting fatty
infiltration. Areas of focal fatty sparing noted near the
gallbladder.

IVC:  Normal caliber.

Pancreas:  Sonographically normal.

Spleen:  Normal size and echogenicity without focal lesions.

Right Kidney:  13.3 cm in length. Normal renal cortical thickness
and echogenicity without focal lesions or hydronephrosis.

Left Kidney:  14.3 cm in length. Normal renal cortical thickness
and echogenicity without focal lesions or hydronephrosis.

Abdominal aorta:  Normal caliber.
IMPRESSION: 1.  Gallbladder distention, small gallbladder polyps, minimal
pericholecystic fluid, borderline dilated common bile duct and
equivocal sonographic Murphy's sign could suggest acalculous
cholecystitis.  Nuclear medicine hepatic biliary scan may be
helpful for evaluation of cystic duct patency.
2.  Diffuse fatty infiltration of the liver with areas of focal
fatty sparing near the gallbladder.

## 2017-09-16 ENCOUNTER — Encounter (HOSPITAL_COMMUNITY): Payer: Self-pay

## 2017-09-16 ENCOUNTER — Emergency Department (HOSPITAL_COMMUNITY)
Admission: EM | Admit: 2017-09-16 | Discharge: 2017-09-16 | Disposition: A | Discharge status: Home / Self Care | Payer: 59 | Attending: Emergency Medicine | Admitting: Emergency Medicine

## 2017-09-16 ENCOUNTER — Other Ambulatory Visit: Payer: Self-pay

## 2017-09-16 ENCOUNTER — Emergency Department (HOSPITAL_COMMUNITY): Payer: 59

## 2017-09-16 DIAGNOSIS — Z7984 Long term (current) use of oral hypoglycemic drugs: Secondary | ICD-10-CM | POA: Diagnosis not present

## 2017-09-16 DIAGNOSIS — N189 Chronic kidney disease, unspecified: Secondary | ICD-10-CM | POA: Diagnosis not present

## 2017-09-16 DIAGNOSIS — Y9389 Activity, other specified: Secondary | ICD-10-CM | POA: Diagnosis not present

## 2017-09-16 DIAGNOSIS — M79602 Pain in left arm: Secondary | ICD-10-CM | POA: Diagnosis present

## 2017-09-16 DIAGNOSIS — Z79899 Other long term (current) drug therapy: Secondary | ICD-10-CM | POA: Insufficient documentation

## 2017-09-16 DIAGNOSIS — Y92410 Unspecified street and highway as the place of occurrence of the external cause: Secondary | ICD-10-CM | POA: Diagnosis not present

## 2017-09-16 DIAGNOSIS — Y999 Unspecified external cause status: Secondary | ICD-10-CM | POA: Diagnosis not present

## 2017-09-16 DIAGNOSIS — E1122 Type 2 diabetes mellitus with diabetic chronic kidney disease: Secondary | ICD-10-CM | POA: Diagnosis not present

## 2017-09-16 DIAGNOSIS — I129 Hypertensive chronic kidney disease with stage 1 through stage 4 chronic kidney disease, or unspecified chronic kidney disease: Secondary | ICD-10-CM | POA: Insufficient documentation

## 2017-09-16 DIAGNOSIS — F17210 Nicotine dependence, cigarettes, uncomplicated: Secondary | ICD-10-CM | POA: Insufficient documentation

## 2017-09-16 DIAGNOSIS — Z7982 Long term (current) use of aspirin: Secondary | ICD-10-CM | POA: Insufficient documentation

## 2017-09-16 MED ORDER — IBUPROFEN 800 MG PO TABS
800.0000 mg | ORAL_TABLET | Freq: Once | ORAL | Status: AC
  Administered 2017-09-16: 800 mg via ORAL
  Filled 2017-09-16: qty 1

## 2017-09-16 MED ORDER — DICLOFENAC SODIUM 3 % TD GEL: 1.0000 "application " | Freq: Two times a day (BID) | TRANSDERMAL | 0 refills | Status: AC

## 2017-09-16 MED ORDER — ACETAMINOPHEN 500 MG PO TABS: 500.0000 mg | ORAL_TABLET | Freq: Four times a day (QID) | ORAL | 0 refills | Status: AC | PRN
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
## 2017-09-16 NOTE — ED Notes (Signed)
Patient transported to X-ray 
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
